# Patient Record
Sex: Male | Born: 1950 | Race: Black or African American | Hispanic: No | Marital: Married
Health system: Southern US, Community
[De-identification: ages and names within clinical notes are randomized; demographics above are authoritative.]

---

## 2015-06-19 ENCOUNTER — Other Ambulatory Visit (HOSPITAL_COMMUNITY): Payer: Self-pay

## 2015-06-19 ENCOUNTER — Inpatient Hospital Stay
Admission: AD | Admit: 2015-06-19 | Discharge: 2015-07-08 | Disposition: A | Discharge status: Skilled Nursing Facility | Payer: Self-pay | Source: Ambulatory Visit | Attending: Internal Medicine | Admitting: Internal Medicine

## 2015-06-19 DIAGNOSIS — W19XXXA Unspecified fall, initial encounter: Secondary | ICD-10-CM

## 2015-06-19 DIAGNOSIS — Z931 Gastrostomy status: Secondary | ICD-10-CM

## 2015-06-19 DIAGNOSIS — Z430 Encounter for attention to tracheostomy: Secondary | ICD-10-CM | POA: Insufficient documentation

## 2015-06-19 DIAGNOSIS — J9601 Acute respiratory failure with hypoxia: Secondary | ICD-10-CM | POA: Insufficient documentation

## 2015-06-19 LAB — BLOOD GAS, ARTERIAL
ACID-BASE EXCESS: 3.1 mmol/L — AB (ref 0.0–2.0)
Bicarbonate: 26.4 mEq/L — ABNORMAL HIGH (ref 20.0–24.0)
FIO2: 0.28
O2 SAT: 97.6 %
PATIENT TEMPERATURE: 98.6
PO2 ART: 93.3 mmHg (ref 80.0–100.0)
TCO2: 27.5 mmol/L (ref 0–100)
pCO2 arterial: 35.4 mmHg (ref 35.0–45.0)
pH, Arterial: 7.486 — ABNORMAL HIGH (ref 7.350–7.450)

## 2015-06-19 LAB — PROCALCITONIN: Procalcitonin: 0.19 ng/mL

## 2015-06-19 LAB — TSH: TSH: 1.641 u[IU]/mL (ref 0.350–4.500)

## 2015-06-19 MED ORDER — DIATRIZOATE MEGLUMINE & SODIUM 66-10 % PO SOLN
ORAL | Status: AC
  Filled 2015-06-19: qty 30

## 2015-06-20 DIAGNOSIS — J9620 Acute and chronic respiratory failure, unspecified whether with hypoxia or hypercapnia: Secondary | ICD-10-CM

## 2015-06-20 NOTE — Consult Note (Signed)
Name: Darren Dodson MRN: [id number] DOB: 08/05/1950    ADMISSION DATE:  06/19/2015 CONSULTATION DATE:  5/12  REFERRING MD :  Sharyon MedicusHijazi   CHIEF COMPLAINT:  Trach management   BRIEF PATIENT DESCRIPTION:  This is a [age] yoaam s/p large right ICH [facility] Course complicated by on-going aphasia, delirium, and HCAP. Ultimately had PEG placed 5/5 and trach 5/8. [facility]. On arrival the pt found to be confused, he is restrained but moves all ext. Has a #8 cuffed trach w/ balloon deflated. Able to phonate; but only answers "yes" to any questions. PCCM was asked to see on 5/12 to assist w/ tracheostomy management.   SIGNIFICANT EVENTS  5/5: PEG 5/8: #8 Trach   STUDIES:    HISTORY OF PRESENT ILLNESS:  See above   PAST MEDICAL HISTORY :  HTN, diastolic dysfxn, CAD w/ prior DES placed Jan 2017.  Dx at time of D/c: S/p right crani for ICH; HCAP, trach dependence, acute encephalopathy   Surgical Hx: as above  Prior to Admission medications   Reviewed    Allergies pepcid and amide-type anetshetics.   FAMILY HISTORY:  family history is not on file. and not available   SOCIAL HISTORY:  not available   REVIEW OF SYSTEMS:   Unable   SUBJECTIVE:  Restless  VITAL SIGNS: 99.3 83, 28, 151/87 sats 96% (30%)  PHYSICAL EXAMINATION: General:  65 year old aam; lying in bed. Restrained. Restless; moves all ext  Neuro:  Awake, no focal motor def. Aphasic and only answers "yes" to questions  HEENT:  Right crani staples in place MMM, no JVD. #8 cuffed trach. Balloon is down. Able to phonate and no distress w/ finger occlusion  Cardiovascular:  RRR w/out MRg Lungs:  Occ rhonchi, no accessory use.  Abdomen:  Sot, PEG intact, + bowel sounds  Musculoskeletal:  Equal strength equal bulk  Skin:  Warm and dry   No results for input(s): NA, K, CL, CO2, BUN, CREATININE, GLUCOSE in the  last 168 hours. No results for input(s): HGB, HCT, WBC, PLT in the last 168 hours. Dg Abd Portable 1v  06/19/2015  CLINICAL DATA:  Gastrostomy placement with contrast injection EXAM: PORTABLE ABDOMEN - 1 VIEW COMPARISON:  None. FINDINGS: Gastrostomy catheter is position in the stomach. Contrast extends from the gastrostomy catheter into the stomach and from the stomach into the proximal duodenum. No contrast extravasation. Bowel gas pattern is normal. No free air evident. IMPRESSION: Gastrostomy catheter positioned in the stomach. No contrast extravasation. Bowel gas pattern normal. Electronically Signed   By: Bretta BangWilliam  Woodruff III M.D.   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 He underwent trach/PEG at outside hospital to facilitate weaning. [facility] now off [facility] vent and tolerating ATC w/out difficulty. It seems as though his biggest barriers at this point are expressive +/- receptive aphasia, delirium, and routine rehab issues. He is actually able to tolerate finger occlusion w/ an 8 cuffed w/ balloon deflated. He will definitely be a candidate to down-size and depending on his rehab  course reasonable to consider decannulation at some point.   Plan Cont current trach Get SLP to start working on [facility]  We can down size him next week (5/18) to 6 cuffless. This should be done w/ tube changer as stoma still relatively new If doing well w/ 6 cuffless would then proceed w/ swallowing assessment Depending on course we can re-assess for occlusive capping trials in hopes of decannulating in the future  Simonne Martinet ACNP-BC [facility] Pulmonary/Critical Care Pager # [phone] OR # [phone] if no  answer   06/20/2015, 1:05 PM

## 2015-06-21 LAB — CBC
HCT: 33.9 % — ABNORMAL LOW (ref 39.0–52.0)
HEMOGLOBIN: 11.4 g/dL — AB (ref 13.0–17.0)
MCH: 30.3 pg (ref 26.0–34.0)
MCHC: 33.6 g/dL (ref 30.0–36.0)
MCV: 90.2 fL (ref 78.0–100.0)
PLATELETS: 492 10*3/uL — AB (ref 150–400)
RBC: 3.76 MIL/uL — ABNORMAL LOW (ref 4.22–5.81)
RDW: 14.7 % (ref 11.5–15.5)
WBC: 9.7 10*3/uL (ref 4.0–10.5)

## 2015-06-21 LAB — BASIC METABOLIC PANEL
ANION GAP: 10 (ref 5–15)
ANION GAP: 10 (ref 5–15)
BUN: 13 mg/dL (ref 6–20)
BUN: 13 mg/dL (ref 6–20)
CALCIUM: 8.5 mg/dL — AB (ref 8.9–10.3)
CHLORIDE: 97 mmol/L — AB (ref 101–111)
CO2: 26 mmol/L (ref 22–32)
CO2: 26 mmol/L (ref 22–32)
CREATININE: 0.83 mg/dL (ref 0.61–1.24)
Calcium: 8.9 mg/dL (ref 8.9–10.3)
Chloride: 95 mmol/L — ABNORMAL LOW (ref 101–111)
Creatinine, Ser: 0.8 mg/dL (ref 0.61–1.24)
GFR calc non Af Amer: >60 mL/min (ref >60)
GFR calc non Af Amer: >60 mL/min (ref >60)
GLUCOSE: 130 mg/dL — AB (ref 65–99)
Glucose, Bld: 124 mg/dL — ABNORMAL HIGH (ref 65–99)
POTASSIUM: 3.6 mmol/L (ref 3.5–5.1)
Potassium: 3.5 mmol/L (ref 3.5–5.1)
SODIUM: 133 mmol/L — AB (ref 135–145)
Sodium: 131 mmol/L — ABNORMAL LOW (ref 135–145)

## 2015-06-21 LAB — PROCALCITONIN

## 2015-06-22 LAB — BASIC METABOLIC PANEL
Anion gap: 9 (ref 5–15)
BUN: 10 mg/dL (ref 6–20)
CALCIUM: 8.9 mg/dL (ref 8.9–10.3)
CO2: 26 mmol/L (ref 22–32)
CREATININE: 0.72 mg/dL (ref 0.61–1.24)
Chloride: 101 mmol/L (ref 101–111)
GLUCOSE: 128 mg/dL — AB (ref 65–99)
Potassium: 4.2 mmol/L (ref 3.5–5.1)
Sodium: 136 mmol/L (ref 135–145)

## 2015-06-23 DIAGNOSIS — Z430 Encounter for attention to tracheostomy: Secondary | ICD-10-CM

## 2015-06-23 DIAGNOSIS — J9601 Acute respiratory failure with hypoxia: Secondary | ICD-10-CM

## 2015-06-23 LAB — CBC
HCT: 35.7 % — ABNORMAL LOW (ref 39.0–52.0)
HEMOGLOBIN: 12.2 g/dL — AB (ref 13.0–17.0)
MCH: 30.9 pg (ref 26.0–34.0)
MCHC: 34.2 g/dL (ref 30.0–36.0)
MCV: 90.4 fL (ref 78.0–100.0)
PLATELETS: 558 10*3/uL — AB (ref 150–400)
RBC: 3.95 MIL/uL — AB (ref 4.22–5.81)
RDW: 14.7 % (ref 11.5–15.5)
WBC: 9.3 10*3/uL (ref 4.0–10.5)

## 2015-06-23 LAB — BASIC METABOLIC PANEL
ANION GAP: 9 (ref 5–15)
BUN: 10 mg/dL (ref 6–20)
CALCIUM: 9.1 mg/dL (ref 8.9–10.3)
CO2: 28 mmol/L (ref 22–32)
CREATININE: 0.77 mg/dL (ref 0.61–1.24)
Chloride: 97 mmol/L — ABNORMAL LOW (ref 101–111)
Glucose, Bld: 127 mg/dL — ABNORMAL HIGH (ref 65–99)
Potassium: 4.2 mmol/L (ref 3.5–5.1)
SODIUM: 134 mmol/L — AB (ref 135–145)

## 2015-06-23 NOTE — Consult Note (Addendum)
   Name: Darren Dodson MRN: [id number] DOB: 10/26/1950    ADMISSION DATE:  06/19/2015 CONSULTATION DATE:  5/12  REFERRING MD :  Sharyon MedicusHijazi   CHIEF COMPLAINT:  Trach management   BRIEF PATIENT DESCRIPTION:  This is a [age] yoaam s/p large right ICH [facility]. Course complicated by on-going aphasia, delirium, and HCAP. Ultimately had PEG placed 5/5 and trach 5/8. [facility]. On arrival the pt found to be confused, he is restrained but moves all ext. Has a #8 cuffed trach w/ balloon deflated. Able to phonate; but only answers "yes" to any questions. PCCM was asked to see on 5/12 to assist w/ tracheostomy management.   SIGNIFICANT EVENTS  5/5: PEG 5/8: #8 Trach   STUDIES:    HISTORY OF PRESENT ILLNESS:  See above   PAST MEDICAL HISTORY :  HTN, diastolic dysfxn, CAD w/ prior DES placed Jan 2017.  Dx at time of D/c: S/p right crani for ICH; HCAP, trach dependence, acute encephalopathy   Surgical Hx: as above  Prior to Admission medications   Reviewed    Allergies pepcid and amide-type anetshetics.   FAMILY HISTORY:  family history is not on file. and not available   SOCIAL HISTORY:  not available   REVIEW OF SYSTEMS:   Unable   SUBJECTIVE:  Restless  VITAL SIGNS: 99.3 83, 28, 151/87 sats 96% (30%)  PHYSICAL EXAMINATION: General:  65 year old aam; lying in bed. Restrained. Restless; moves all ext  Neuro:  Awake, no focal motor def. Aphasic and only answers "yes" to questions  HEENT:  Right crani staples in place MMM, no JVD. #8 cuffed trach. Balloon is down. Able to phonate and no distress w/ finger occlusion  Cardiovascular:  RRR w/out MRg Lungs:  Occ rhonchi, no accessory use.  Abdomen:  Sot, PEG intact, + bowel sounds  Musculoskeletal:  Equal strength equal bulk  Skin:  Warm and dry    Recent Labs Lab 06/21/15 1636 06/22/15 1227 06/23/15 0641  NA 131*  136 134*  K 3.6 4.2 4.2  CL 95* 101 97*  CO2 26 26 28   BUN 13 10 10   CREATININE 0.80 0.72 0.77  GLUCOSE 130* 128* 127*    Recent Labs Lab 06/21/15 0855 06/23/15 0641  HGB 11.4* 12.2*  HCT 33.9* 35.7*  WBC 9.7 9.3  PLT 492* 558*   No results found.  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 He underwent trach/PEG at outside hospital to facilitate weaning. [facility] off [facility] vent and tolerating ATC w/out difficulty.   Pt's trache has been down sized and he is capped right now.  Plan to keep capped for 48 hrs. If doing well with no distress, plan to decannulate him.   No family at bedside.   Pollie MeyerJ. Angelo A de Dios, MD 06/23/2015, 5:32 PM [place] Pulmonary and Critical Care Pager [phone] After 3 pm or if no answer, call [phone]     06/23/2015, 5:30 PM

## 2015-06-25 ENCOUNTER — Other Ambulatory Visit (HOSPITAL_COMMUNITY): Payer: Self-pay

## 2015-06-26 DIAGNOSIS — Z931 Gastrostomy status: Secondary | ICD-10-CM

## 2015-06-26 LAB — CBC WITH DIFFERENTIAL/PLATELET
Basophils Absolute: 0 10*3/uL (ref 0.0–0.1)
Basophils Relative: 0 %
EOS ABS: 0.2 10*3/uL (ref 0.0–0.7)
EOS PCT: 3 %
HCT: 36.9 % — ABNORMAL LOW (ref 39.0–52.0)
HEMOGLOBIN: 12.5 g/dL — AB (ref 13.0–17.0)
LYMPHS ABS: 2 10*3/uL (ref 0.7–4.0)
Lymphocytes Relative: 26 %
MCH: 30.6 pg (ref 26.0–34.0)
MCHC: 33.9 g/dL (ref 30.0–36.0)
MCV: 90.4 fL (ref 78.0–100.0)
MONO ABS: 0.5 10*3/uL (ref 0.1–1.0)
MONOS PCT: 6 %
Neutro Abs: 5 10*3/uL (ref 1.7–7.7)
Neutrophils Relative %: 65 %
PLATELETS: 625 10*3/uL — AB (ref 150–400)
RBC: 4.08 MIL/uL — ABNORMAL LOW (ref 4.22–5.81)
RDW: 15.4 % (ref 11.5–15.5)
WBC: 7.7 10*3/uL (ref 4.0–10.5)

## 2015-06-26 LAB — BASIC METABOLIC PANEL
Anion gap: 13 (ref 5–15)
BUN: 14 mg/dL (ref 6–20)
CALCIUM: 9.2 mg/dL (ref 8.9–10.3)
CHLORIDE: 96 mmol/L — AB (ref 101–111)
CO2: 25 mmol/L (ref 22–32)
CREATININE: 0.74 mg/dL (ref 0.61–1.24)
GFR calc Af Amer: >60 mL/min (ref >60)
GFR calc non Af Amer: >60 mL/min (ref >60)
Glucose, Bld: 137 mg/dL — ABNORMAL HIGH (ref 65–99)
Potassium: 4.4 mmol/L (ref 3.5–5.1)
SODIUM: 134 mmol/L — AB (ref 135–145)

## 2015-06-26 NOTE — Progress Notes (Signed)
   Name: Darren Dodson C Jastrzebski MRN: [id number] DOB: 04/09/1950    ADMISSION DATE:  06/19/2015 CONSULTATION DATE:  5/12  REFERRING MD :  Sharyon MedicusHijazi   CHIEF COMPLAINT:  Trach management   BRIEF PATIENT DESCRIPTION:  This is a [age] yoaam s/p large right ICH [facility]. Course complicated by on-going aphasia, delirium, and HCAP. Ultimately had PEG placed 5/5 and trach 5/8. [facility] efforts. On arrival the pt found to be confused, he is restrained but moves all ext. Has a #8 cuffed trach w/ balloon deflated. Able to phonate; but only answers "yes" to any questions. PCCM was asked to see on 5/12 to assist w/ tracheostomy management.   SIGNIFICANT EVENTS  5/5: PEG 5/8: #8 Trach   STUDIES:    HISTORY OF PRESENT ILLNESS:  See above   PAST MEDICAL HISTORY :  HTN, diastolic dysfxn, CAD w/ prior DES placed Jan 2017.  Dx at time of D/c: S/p right crani for ICH; HCAP, trach dependence, acute encephalopathy   Surgical Hx: as above  Prior to Admission medications   Reviewed    Allergies pepcid and amide-type anetshetics.   FAMILY HISTORY:  family history is not on file. and not available   SOCIAL HISTORY:  not available   REVIEW OF SYSTEMS:   Unable   SUBJECTIVE:  Restless  VITAL SIGNS: 99.3 83, 28, 151/87 sats 96% (30%)  PHYSICAL EXAMINATION: General:  65 year old aam; lying in bed. Restrained. Restless; moves all ext  Neuro:  Awake, no focal motor def. Aphasic and only answers "yes" to questions  HEENT:  Right crani staples in place MMM, no JVD. #8 cuffed trach. Balloon is down. Able to phonate and no distress w/ finger occlusion  Cardiovascular:  RRR w/out MRg Lungs:  Occ rhonchi, no accessory use.  Abdomen:  Sot, PEG intact, + bowel sounds  Musculoskeletal:  Equal strength equal bulk  Skin:  Warm and dry    Recent Labs Lab 06/22/15 1227 06/23/15 0641 06/26/15 0627  NA 136  134* 134*  K 4.2 4.2 4.4  CL 101 97* 96*  CO2 26 28 25   BUN 10 10 14   CREATININE 0.72 0.77 0.74  GLUCOSE 128* 127* 137*    Recent Labs Lab 06/21/15 0855 06/23/15 0641 06/26/15 0627  HGB 11.4* 12.2* 12.5*  HCT 33.9* 35.7* 36.9*  WBC 9.7 9.3 7.7  PLT 492* 558* 625*   No results found.  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 He underwent trach/PEG at outside hospital to facilitate weaning. [facility] off [facility] vent and tolerating ATC w/out difficulty.   Pt has been decannulated. Tolerating it. No issues. PCCM will sign off. Call back if with issues.    Darren MeyerJ. Angelo A de Dios, MD 06/26/2015, 1:29 PM [place] Pulmonary and Critical Care Pager [phone] After 3 pm or if no answer, call [phone]     06/26/2015, 1:29 PM

## 2015-06-30 ENCOUNTER — Other Ambulatory Visit (HOSPITAL_COMMUNITY): Payer: Self-pay

## 2015-06-30 LAB — CBC WITH DIFFERENTIAL/PLATELET
BASOS ABS: 0 10*3/uL (ref 0.0–0.1)
BASOS PCT: 1 %
EOS PCT: 2 %
Eosinophils Absolute: 0.1 10*3/uL (ref 0.0–0.7)
HCT: 39.4 % (ref 39.0–52.0)
Hemoglobin: 13.4 g/dL (ref 13.0–17.0)
Lymphocytes Relative: 28 %
Lymphs Abs: 1.7 10*3/uL (ref 0.7–4.0)
MCH: 30.7 pg (ref 26.0–34.0)
MCHC: 34 g/dL (ref 30.0–36.0)
MCV: 90.2 fL (ref 78.0–100.0)
MONO ABS: 0.4 10*3/uL (ref 0.1–1.0)
Monocytes Relative: 6 %
NEUTROS ABS: 3.9 10*3/uL (ref 1.7–7.7)
Neutrophils Relative %: 63 %
PLATELETS: 555 10*3/uL — AB (ref 150–400)
RBC: 4.37 MIL/uL (ref 4.22–5.81)
RDW: 14.4 % (ref 11.5–15.5)
WBC: 6.1 10*3/uL (ref 4.0–10.5)

## 2015-06-30 LAB — BASIC METABOLIC PANEL
ANION GAP: 10 (ref 5–15)
BUN: 12 mg/dL (ref 6–20)
CALCIUM: 9.7 mg/dL (ref 8.9–10.3)
CO2: 27 mmol/L (ref 22–32)
CREATININE: 0.88 mg/dL (ref 0.61–1.24)
Chloride: 96 mmol/L — ABNORMAL LOW (ref 101–111)
GFR calc non Af Amer: >60 mL/min (ref >60)
Glucose, Bld: 116 mg/dL — ABNORMAL HIGH (ref 65–99)
Potassium: 4.1 mmol/L (ref 3.5–5.1)
Sodium: 133 mmol/L — ABNORMAL LOW (ref 135–145)

## 2015-07-03 LAB — CBC
HCT: 38.9 % — ABNORMAL LOW (ref 39.0–52.0)
Hemoglobin: 12.7 g/dL — ABNORMAL LOW (ref 13.0–17.0)
MCH: 30.2 pg (ref 26.0–34.0)
MCHC: 32.6 g/dL (ref 30.0–36.0)
MCV: 92.6 fL (ref 78.0–100.0)
PLATELETS: 424 10*3/uL — AB (ref 150–400)
RBC: 4.2 MIL/uL — AB (ref 4.22–5.81)
RDW: 14.7 % (ref 11.5–15.5)
WBC: 5.9 10*3/uL (ref 4.0–10.5)

## 2015-07-03 LAB — BASIC METABOLIC PANEL
ANION GAP: 7 (ref 5–15)
BUN: 9 mg/dL (ref 6–20)
CALCIUM: 9.4 mg/dL (ref 8.9–10.3)
CO2: 27 mmol/L (ref 22–32)
CREATININE: 0.83 mg/dL (ref 0.61–1.24)
Chloride: 100 mmol/L — ABNORMAL LOW (ref 101–111)
GFR calc Af Amer: >60 mL/min (ref >60)
Glucose, Bld: 107 mg/dL — ABNORMAL HIGH (ref 65–99)
Potassium: 4 mmol/L (ref 3.5–5.1)
Sodium: 134 mmol/L — ABNORMAL LOW (ref 135–145)

## 2015-07-06 LAB — CBC WITH DIFFERENTIAL/PLATELET
Basophils Absolute: 0.1 K/uL (ref 0.0–0.1)
Basophils Relative: 1 %
Eosinophils Absolute: 0.4 K/uL (ref 0.0–0.7)
Eosinophils Relative: 6 %
HCT: 36.2 % — ABNORMAL LOW (ref 39.0–52.0)
Hemoglobin: 11.7 g/dL — ABNORMAL LOW (ref 13.0–17.0)
Lymphocytes Relative: 37 %
Lymphs Abs: 2.2 K/uL (ref 0.7–4.0)
MCH: 30.2 pg (ref 26.0–34.0)
MCHC: 32.3 g/dL (ref 30.0–36.0)
MCV: 93.3 fL (ref 78.0–100.0)
Monocytes Absolute: 0.3 K/uL (ref 0.1–1.0)
Monocytes Relative: 6 %
Neutro Abs: 3.1 K/uL (ref 1.7–7.7)
Neutrophils Relative %: 50 %
Platelets: 322 K/uL (ref 150–400)
RBC: 3.88 MIL/uL — ABNORMAL LOW (ref 4.22–5.81)
RDW: 14.8 % (ref 11.5–15.5)
WBC: 6 K/uL (ref 4.0–10.5)

## 2015-07-06 LAB — BASIC METABOLIC PANEL
ANION GAP: 8 (ref 5–15)
BUN: 10 mg/dL (ref 6–20)
CHLORIDE: 100 mmol/L — AB (ref 101–111)
CO2: 28 mmol/L (ref 22–32)
Calcium: 9.3 mg/dL (ref 8.9–10.3)
Creatinine, Ser: 0.98 mg/dL (ref 0.61–1.24)
GFR calc Af Amer: >60 mL/min (ref >60)
GFR calc non Af Amer: >60 mL/min (ref >60)
GLUCOSE: 110 mg/dL — AB (ref 65–99)
POTASSIUM: 4.1 mmol/L (ref 3.5–5.1)
SODIUM: 136 mmol/L (ref 135–145)

## 2015-07-08 LAB — BASIC METABOLIC PANEL
Anion gap: 9 (ref 5–15)
BUN: 9 mg/dL (ref 6–20)
CALCIUM: 9.3 mg/dL (ref 8.9–10.3)
CHLORIDE: 103 mmol/L (ref 101–111)
CO2: 26 mmol/L (ref 22–32)
CREATININE: 0.9 mg/dL (ref 0.61–1.24)
GFR calc non Af Amer: >60 mL/min (ref >60)
Glucose, Bld: 106 mg/dL — ABNORMAL HIGH (ref 65–99)
Potassium: 4.1 mmol/L (ref 3.5–5.1)
SODIUM: 138 mmol/L (ref 135–145)

## 2015-07-08 LAB — CBC
HEMATOCRIT: 36.1 % — AB (ref 39.0–52.0)
HEMOGLOBIN: 11.6 g/dL — AB (ref 13.0–17.0)
MCH: 29.8 pg (ref 26.0–34.0)
MCHC: 32.1 g/dL (ref 30.0–36.0)
MCV: 92.8 fL (ref 78.0–100.0)
Platelets: 294 10*3/uL (ref 150–400)
RBC: 3.89 MIL/uL — ABNORMAL LOW (ref 4.22–5.81)
RDW: 14.8 % (ref 11.5–15.5)
WBC: 5.6 10*3/uL (ref 4.0–10.5)

## 2017-09-19 IMAGING — CR DG ABD PORTABLE 1V
1 series · 1 of 1 positions shown · IV contrast (agent unspecified)
Comparison: None.

CLINICAL DATA: Gastrostomy placement with contrast injection

EXAM:
PORTABLE ABDOMEN - 1 VIEW

[supine ap]
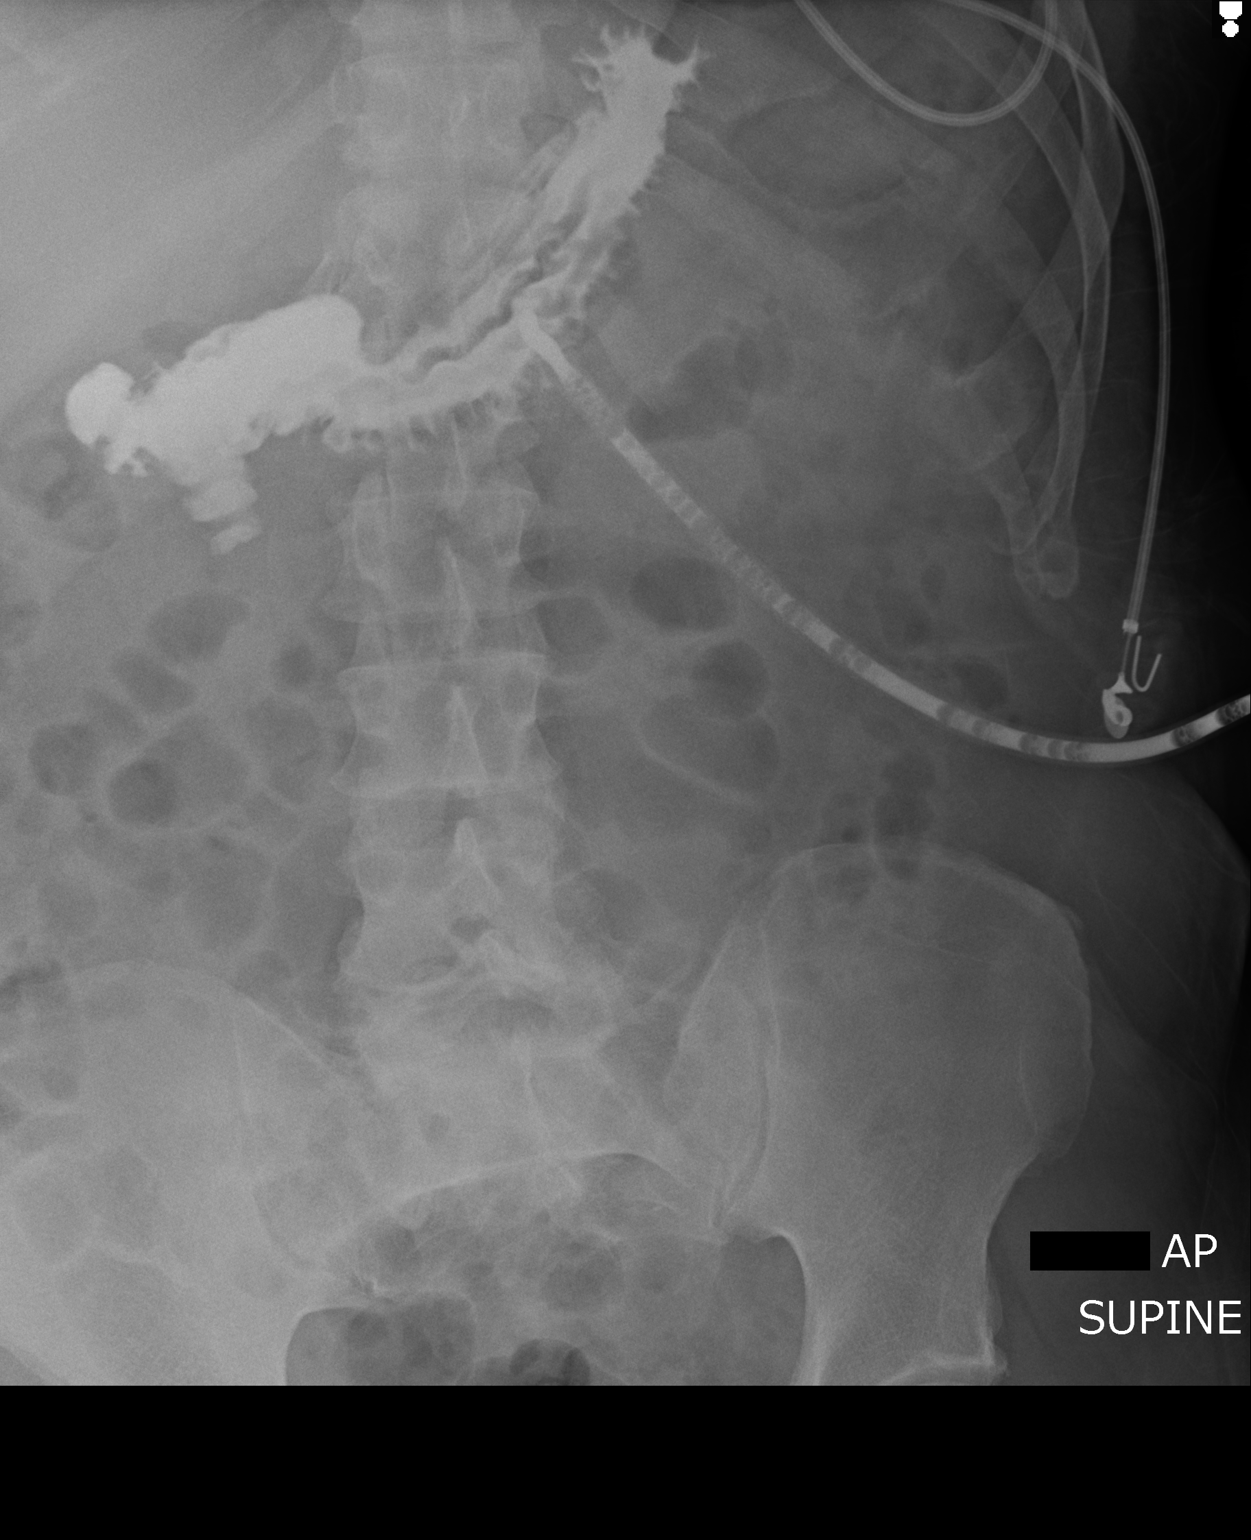

[1 of 1 positions shown; findings below may reference images not displayed]

FINDINGS: Gastrostomy catheter is position in the stomach. Contrast extends
from the gastrostomy catheter into the stomach and from the stomach
into the proximal duodenum. No contrast extravasation. Bowel gas
pattern is normal. No free air evident.
IMPRESSION: Gastrostomy catheter positioned in the stomach. No contrast
extravasation. Bowel gas pattern normal.

## 2017-09-25 IMAGING — RF DG SWALLOWING FUNCTION - NRPT [facility]
1 series · 18 of 24 positions shown · non-contrast
Comparison: none

[Series 1: run · 6 acquisitions, 18 frames shown]
[im 1/6]
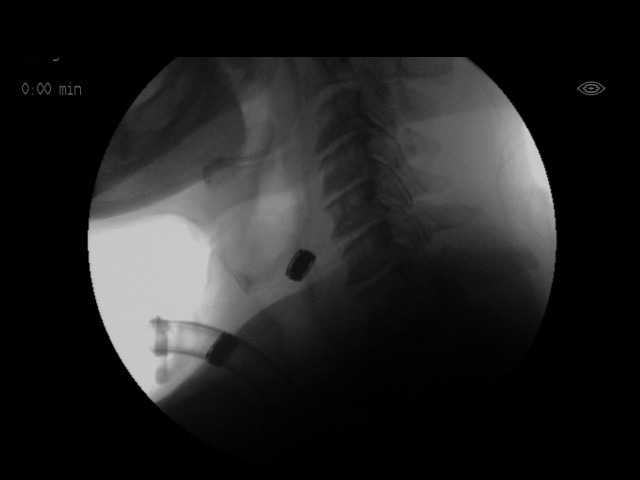
[im 1/6]
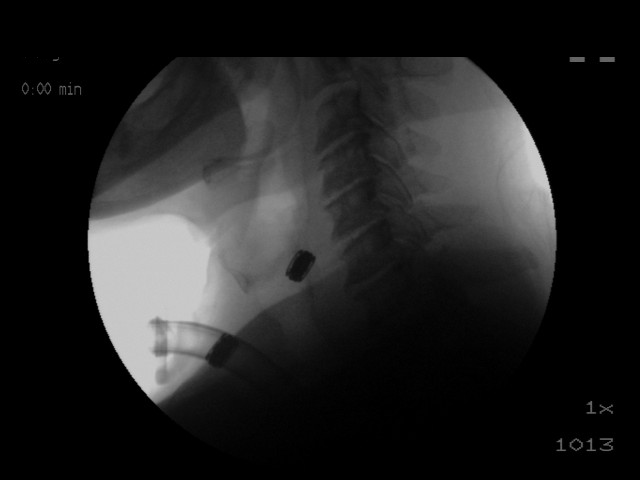
[im 1/6]
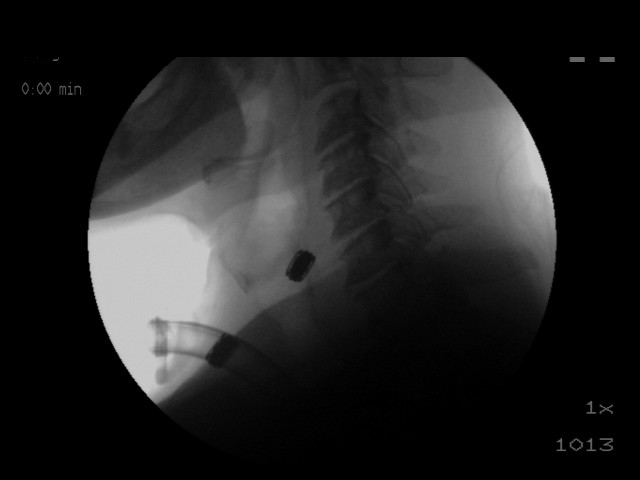
[im 2/6]
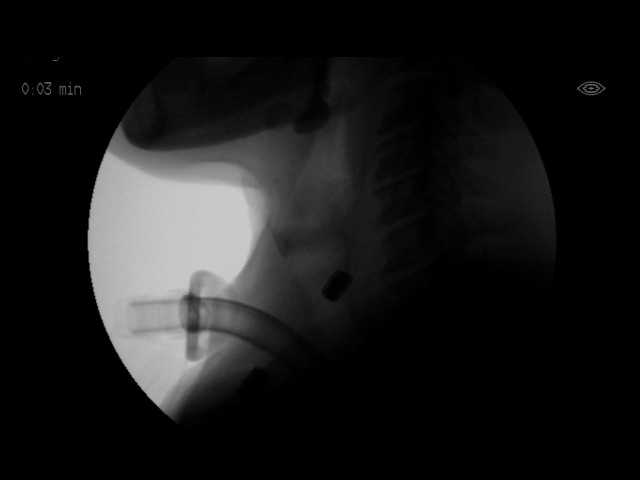
[im 2/6]
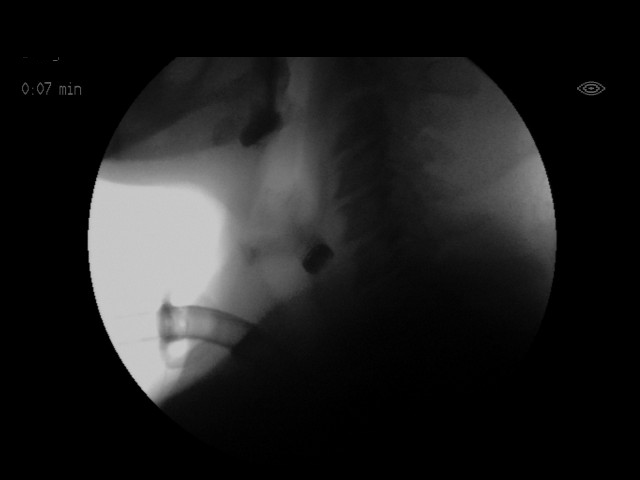
[im 2/6]
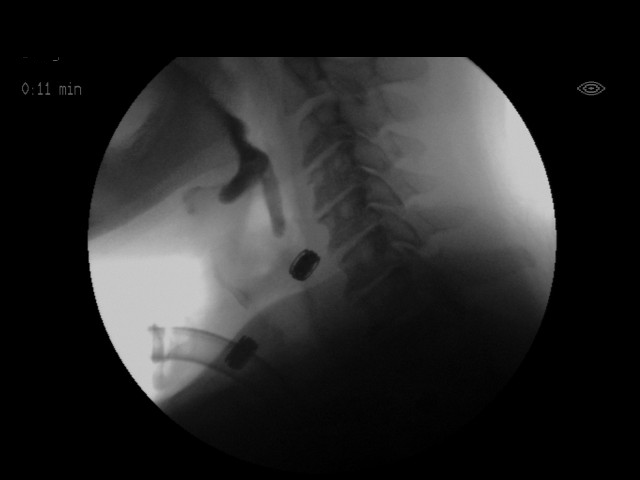
[im 3/6]
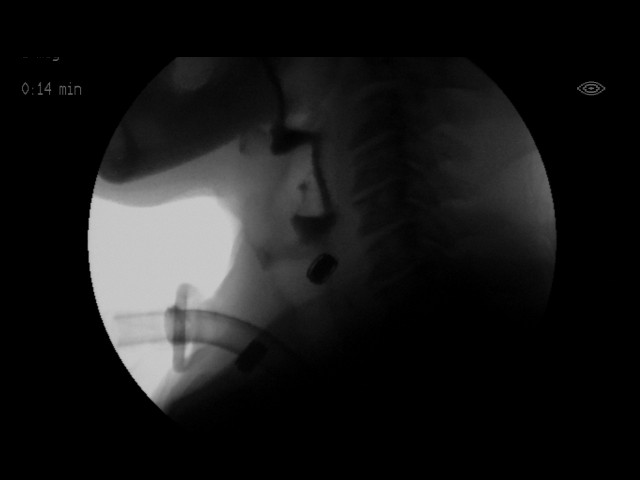
[im 3/6]
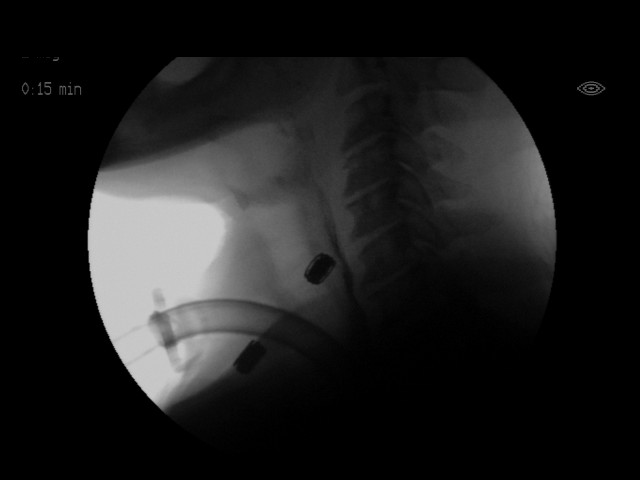
[im 3/6]
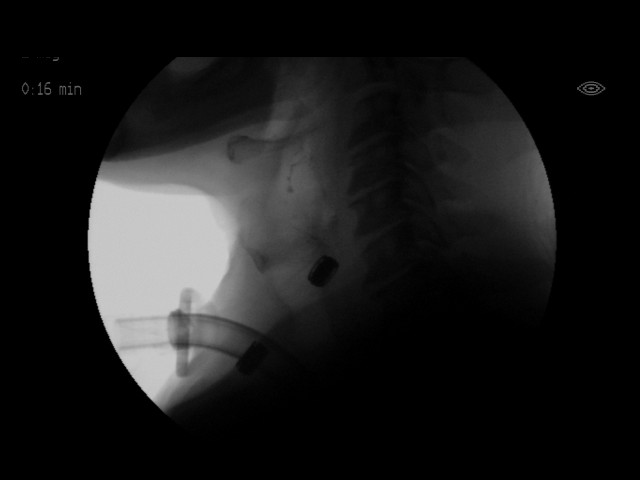
[im 4/6]
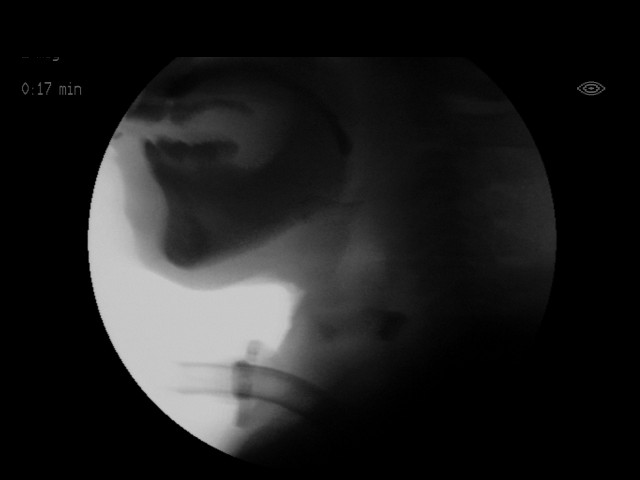
[im 4/6]
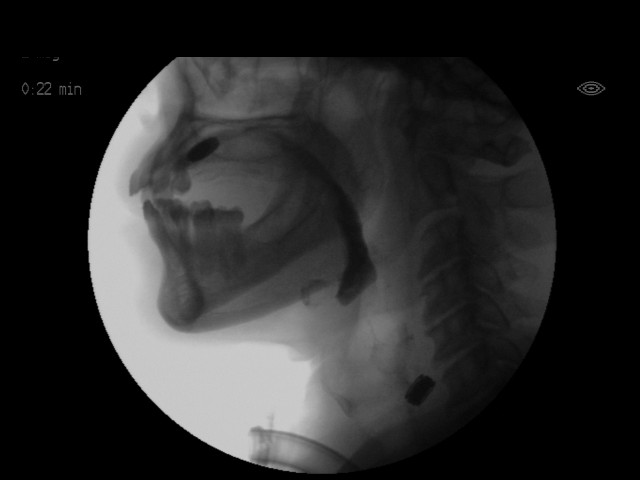
[im 4/6]
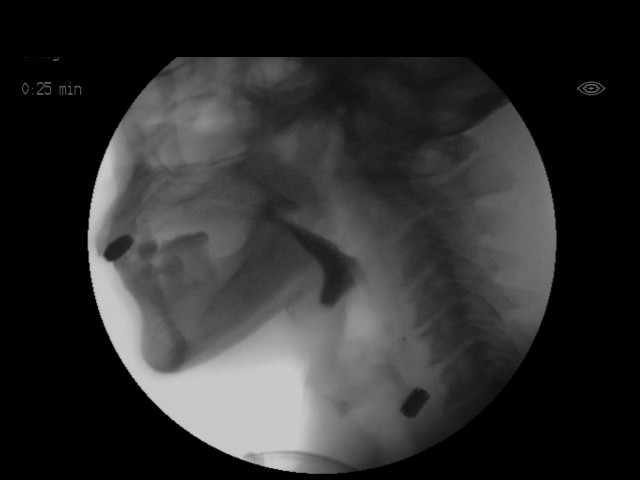
[im 5/6]
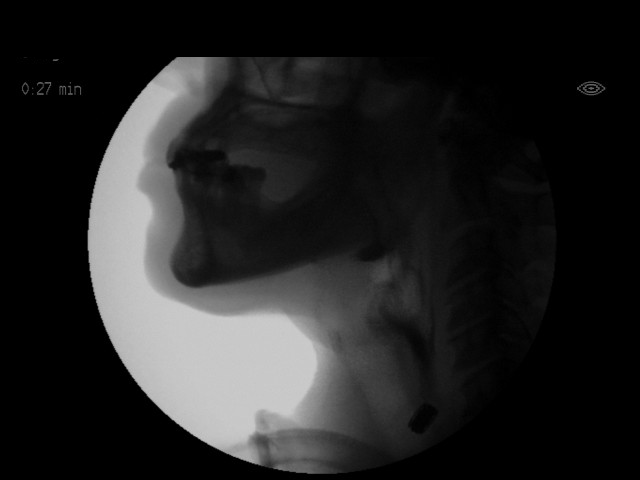
[im 5/6]
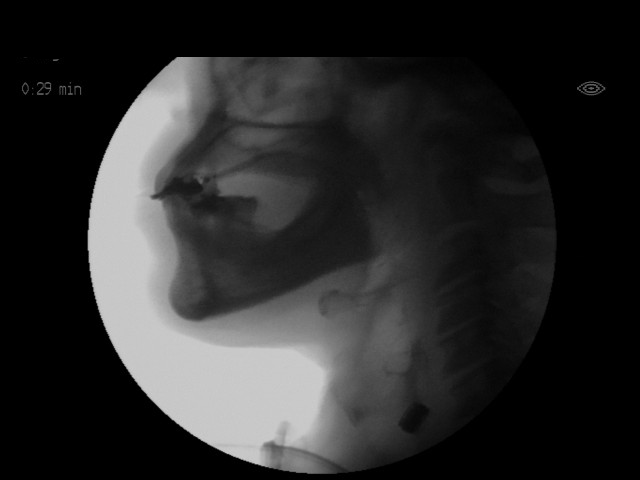
[im 5/6]
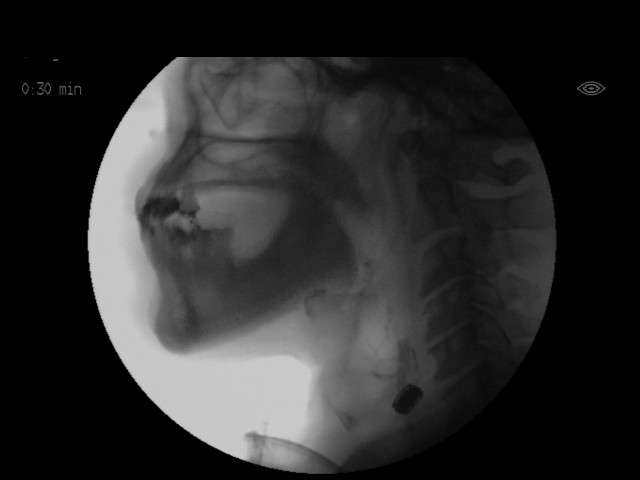
[im 6/6]
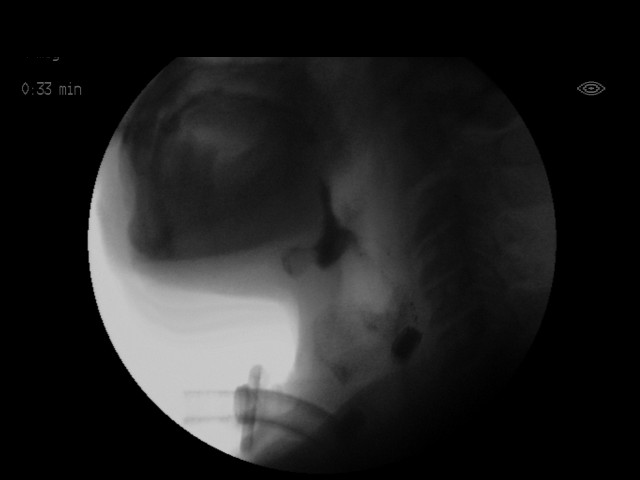
[im 6/6]
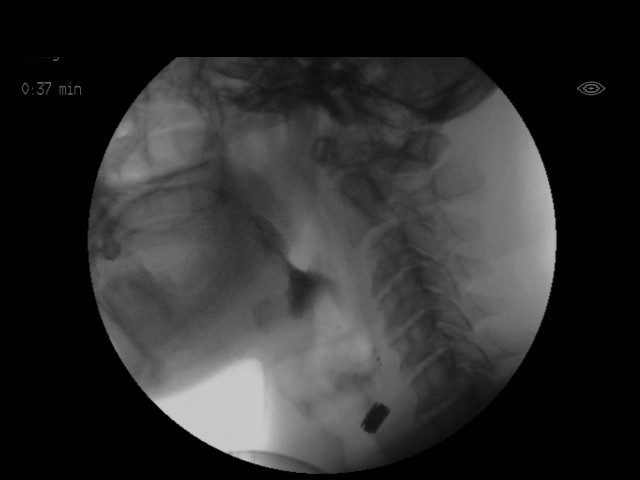
[im 6/6]
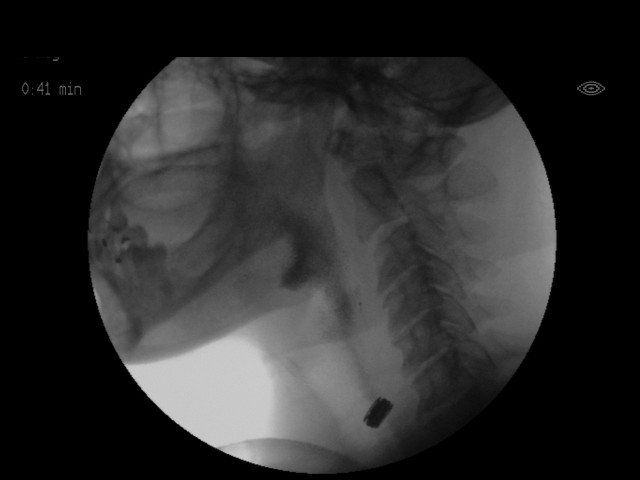

[18 of 24 positions shown; findings below may reference images not displayed]

FLUOROSCOPY FOR SWALLOWING FUNCTION STUDY:
Fluoroscopy was provided for swallowing function study, which was administered by a speech pathologist.  Final results and recommendations from this study are contained within the speech pathology report.

## 2017-09-30 IMAGING — CT CT HEAD W/O CM
3 series · 15 of 47 positions shown, 18 images · non-contrast
Comparison: None.

CLINICAL DATA: Status post fall from hospital stretcher, hitting
front of head. Initial encounter.

EXAM:
CT HEAD WITHOUT CONTRAST
TECHNIQUE: Contiguous axial images were obtained from the base of the skull
through the vertex without intravenous contrast.

[Series 2: head 5.0 h30s · axial · 0.42mm/px · z∈[-554,-414]mm · 9 of 34 slices shown, 12 images]
[im 3/34  brain]
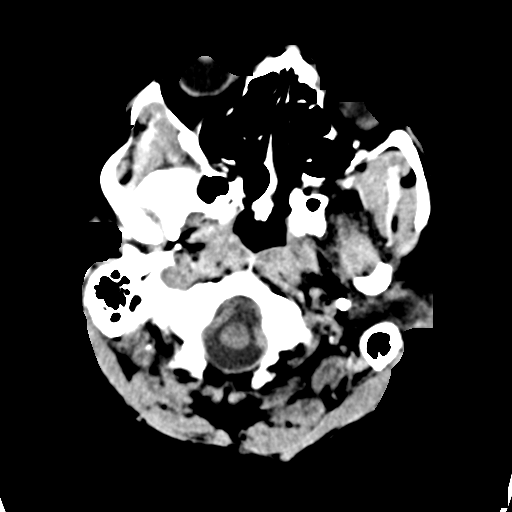
[im 3/34  bone]
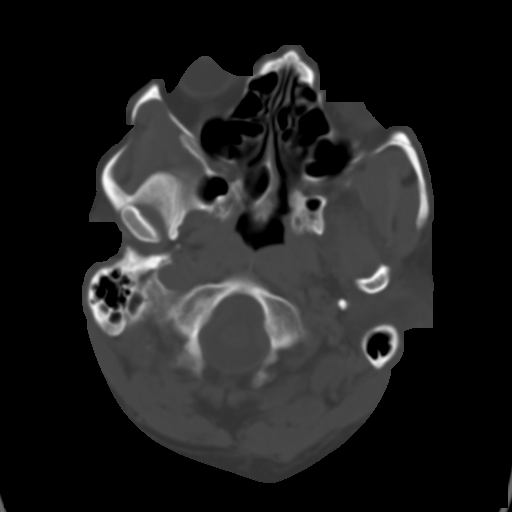
[im 6/34  brain]
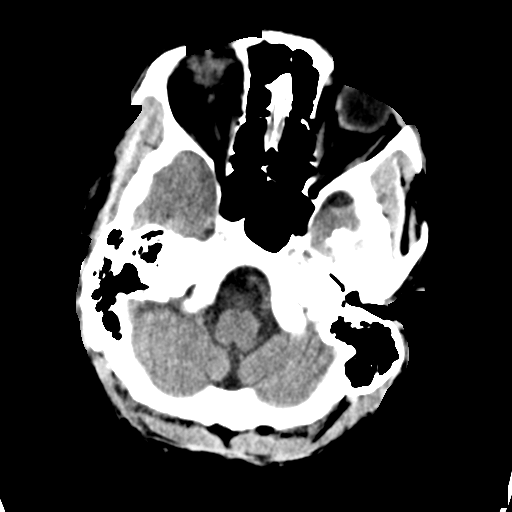
[im 10/34  brain]
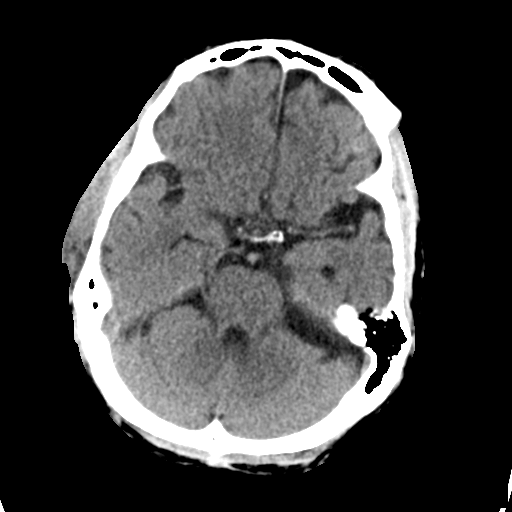
[im 13/34  brain]
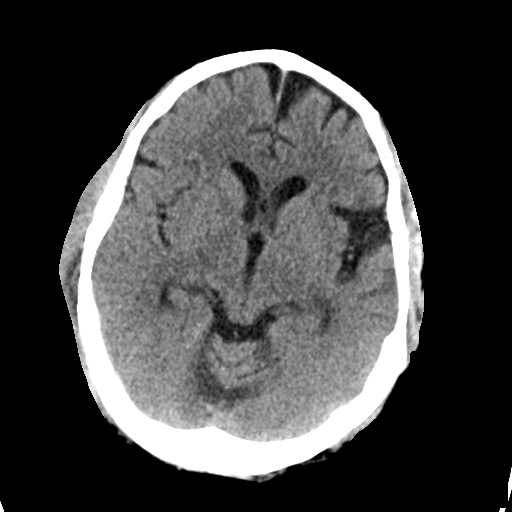
[im 18/34  brain]
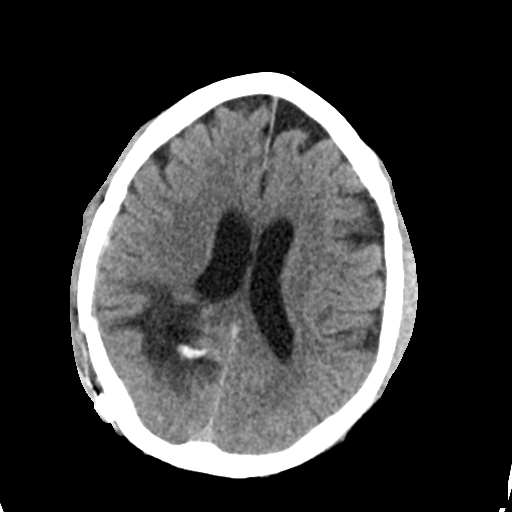
[im 18/34  bone]
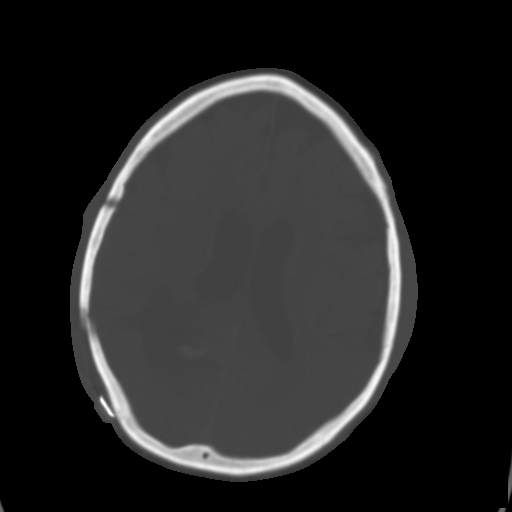
[im 21/34  brain]
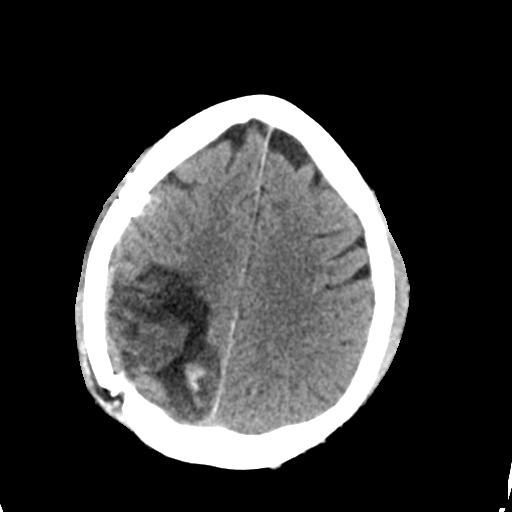
[im 24/34  brain]
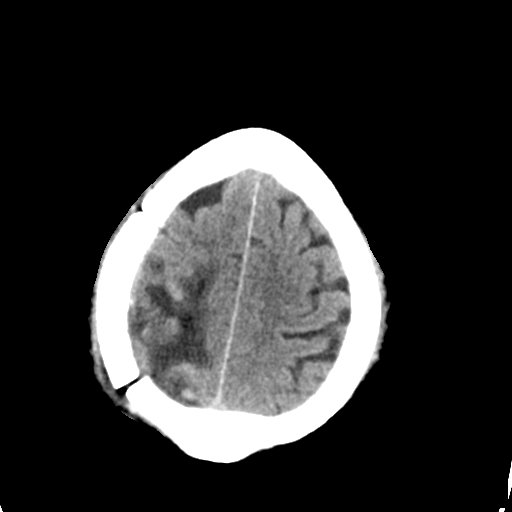
[im 28/34  brain]
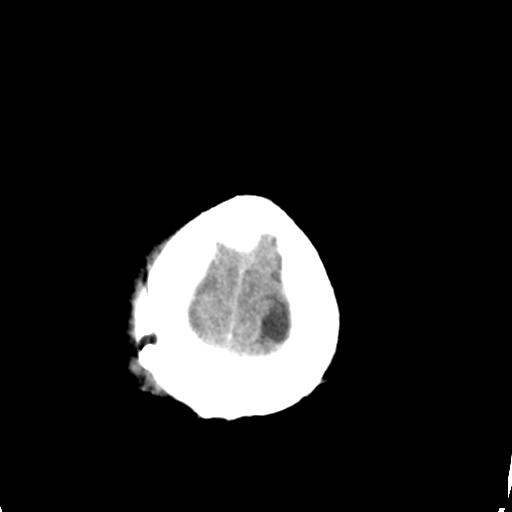
[im 31/34  brain]
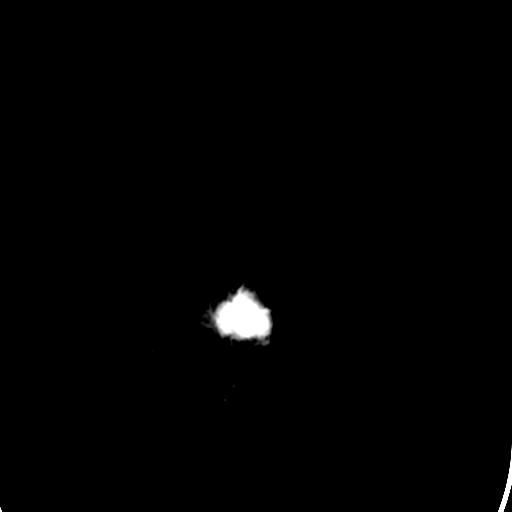
[im 31/34  bone]
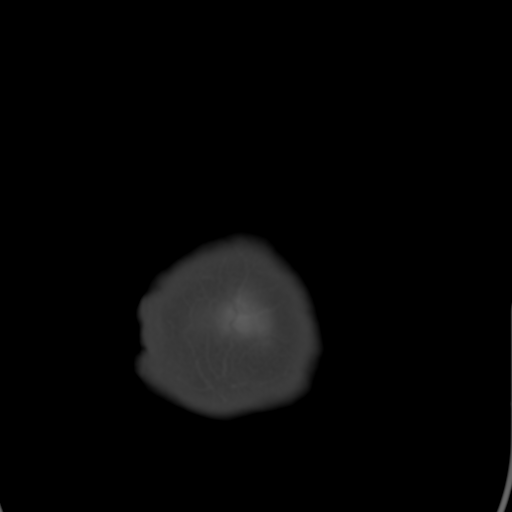

[Series 4: head 3.0 mpr · coronal · 0.31mm/px · 3 of 72 slices shown (1 of 2)]
[im 24/72  brain]
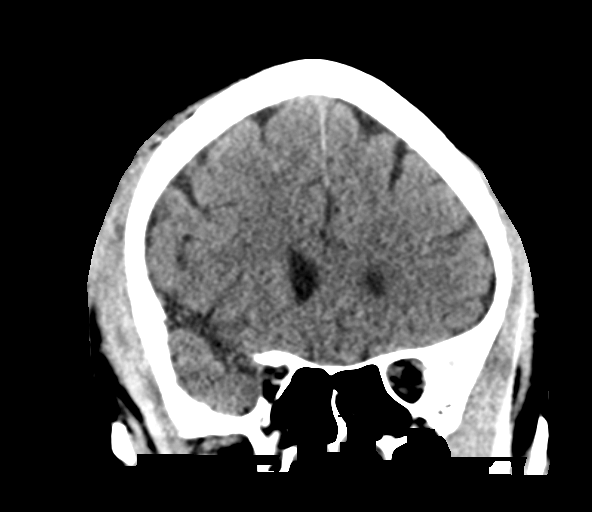
[im 32/72  brain]
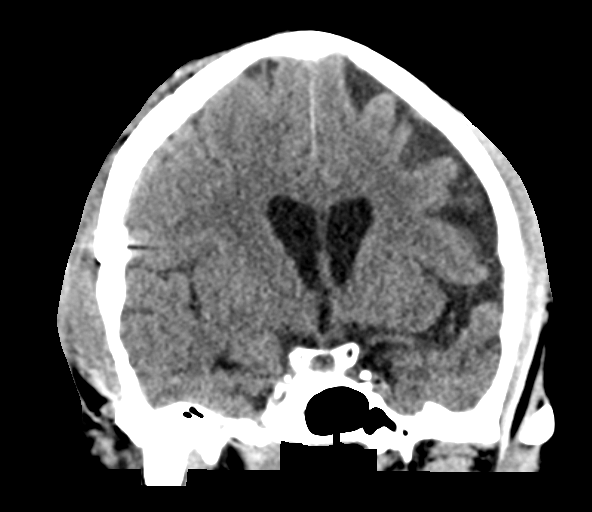
[im 40/72  brain]
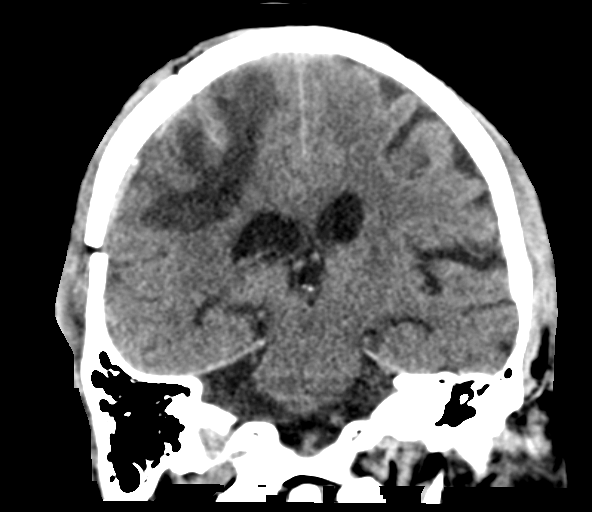

[Series 5: head 3.0 mpr · sagittal · 0.32mm/px · 3 of 56 slices shown (2 of 2)]
[im 19/56  brain]
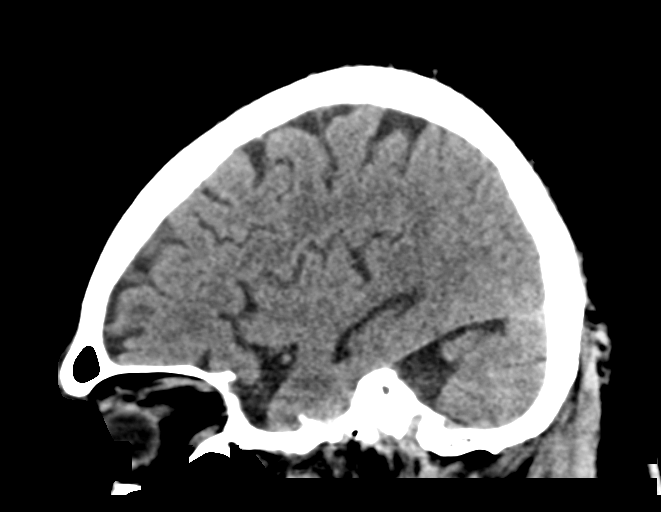
[im 28/56  brain]
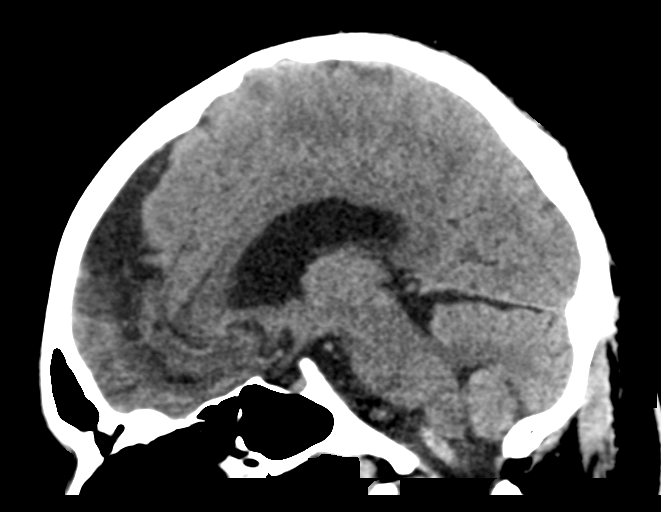
[im 37/56  brain]
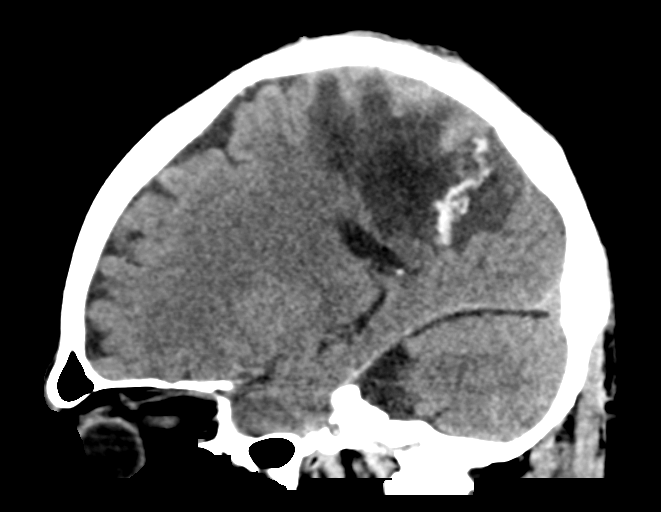

[15 of 47 positions shown; findings below may reference images not displayed]

FINDINGS: Postoperative change is noted at the high right parietal lobe, with
associated encephalomalacia and a focus of high attenuation likely
reflecting focal calcification. Trace adjacent fat is noted. There
is no evidence of acute hemorrhage. No mass lesion or acute
infarction is identified.

Prominence of the ventricles and sulci reflects mild cortical volume
loss. Cerebellar atrophy is noted. Mild periventricular white matter
change likely reflects small vessel ischemic microangiopathy.

The brainstem and fourth ventricle are within normal limits. The
basal ganglia are unremarkable in appearance. The cerebral
hemispheres demonstrate grossly normal gray-white differentiation.
No mass effect or midline shift is seen.

There is no evidence of fracture; visualized osseous structures are
unremarkable in appearance. The visualized portions of the orbits
are within normal limits. The paranasal sinuses and mastoid air
cells are well-aerated. No significant soft tissue abnormalities are
seen.
IMPRESSION: 1. No evidence of traumatic intracranial injury or fracture.
2. Postoperative change at the high right parietal lobe, with
associated encephalomalacia and high attenuation likely reflecting
focal calcification. Trace adjacent fat noted.
3. Mild cortical volume loss and scattered small vessel ischemic
microangiopathy

## 2023-01-09 DEATH — deceased
